# Patient Record
Sex: Male | Born: 1995 | Race: White | Hispanic: No | Marital: Single | Smoking: Never smoker
Health system: Southern US, Community
[De-identification: ages and names within clinical notes are randomized; demographics above are authoritative.]

## PROBLEM LIST (undated history)

## (undated) ENCOUNTER — Ambulatory Visit: Admission: EM | Payer: 59 | Source: Home / Self Care

## (undated) DIAGNOSIS — T7840XA Allergy, unspecified, initial encounter: Secondary | ICD-10-CM

## (undated) HISTORY — DX: Allergy, unspecified, initial encounter: T78.40XA

---
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
## 2014-04-24 ENCOUNTER — Encounter: Payer: Self-pay | Admitting: Family Medicine

## 2014-04-24 ENCOUNTER — Ambulatory Visit (INDEPENDENT_AMBULATORY_CARE_PROVIDER_SITE_OTHER): Payer: BC Managed Care – PPO | Admitting: Family Medicine

## 2014-04-24 VITALS — BP 98/60 | HR 76 | Temp 98.7°F | Resp 16 | Ht 70.5 in | Wt 163.2 lb

## 2014-04-24 DIAGNOSIS — G250 Essential tremor: Secondary | ICD-10-CM

## 2014-04-24 DIAGNOSIS — F329 Major depressive disorder, single episode, unspecified: Secondary | ICD-10-CM

## 2014-04-24 DIAGNOSIS — F32A Depression, unspecified: Secondary | ICD-10-CM

## 2014-04-24 DIAGNOSIS — R5383 Other fatigue: Secondary | ICD-10-CM

## 2014-04-24 MED ORDER — PROPRANOLOL HCL 10 MG PO TABS: 20.0000 mg | ORAL_TABLET | Freq: Two times a day (BID) | ORAL | Status: DC

## 2014-04-24 MED ORDER — FLUOXETINE HCL 20 MG PO TABS: 20.0000 mg | ORAL_TABLET | Freq: Every day | ORAL | Status: DC

## 2014-04-24 NOTE — Progress Notes (Addendum)
Subjective:  This chart was scribed for Jack Ray, MD by Erling Conte, Medical Scribe. This patient was seen in Room 22 and the patient's care was started at 4:53 PM.   Patient ID: Jack Hood, male    DOB: 02/17/1996, 18 y.o.   MRN: [id number]  Chief Complaint  Patient presents with  . Follow-up    MEDICATION - PROPRANOLOL  take 2 tablets 20 mg twice a day (71m), and STRESS x 3 months    HPI HPI Comments: LOSIE AMPAROis a 18y.o. male who presents to the [facility] for refill of Propanolol. Last seen by RChristell Faithin July 2014. He was diagnosed with benign tremor. Evaluated by neurology when he was a child. Multiple family members with tremor. Only affects his hands. Was treated with propanolol 3x per day by RThurmond Buttsfor essential tremor at the July office visit. Was eventually started at bedtime and titrated up as needed. Was doing well with 2 pills 2x per day as of phone notes in 2014.Per mother pt stopped taking it for a while and then the pt restarted it about 1 month ago. Pt has been taking it 2x per day and can see the benefits for his tremor. He does note some slight increased fatigue with the medication but states he was fatigued before that. Pt states he is probably sleeping around 10 hours a day. Pt states it is partly to do with being fatigued and also just not feeling motivated to get out of bed. No naps during the day. He admits to maybe 1 caffeine drink per day, usually tea with dinner.   He admits school is stressful. He is a sAdministrator, arts eEngineer, productionmajor at [employer] He notes some depression with school that began halfway through this semester. He has not met with any therapists about it. Pt notes feeling overwhelmed at times. He denied feeling this in high school but states that he did feel anxious and overwhelmed in middle school. Pt states that in middle school is was more social anxiety while now it is more academic pressure. Pt grades have dropped  from a 3.4 in his previous semesters to a 2.3 this past semester. He states that for fun he hangs out with his fraternity brothers. Pt [facility]ural [facility] [employer] Pt admits to some mild associated anhedonia.  Mother states he has been a lot more emotional and having some self worth issues. Pt just recently got out of a relationship. He has never taken medication for depression or anxiety in the past. He denies any other complaints at this time.   Interviewed without parent in room. Trouble with motivation as above, concentration.  Had breakup with relationship of 1 and 1/2 years few months ago that contributed some to depression symptoms.    There are no active problems to display for this patient.  Past Medical History  Diagnosis Date  . Allergy    No past surgical history on file. No Known Allergies Prior to Admission medications   Medication Sig Start Date End Date Taking? Authorizing Provider  propranolol (INDERAL) 10 MG tablet Take 2 tablets (20 mg total) by mouth 2 (two) times daily. 01/19/13  Yes RRise Mu PA-C   History   Social History  . Marital Status: Single    Spouse Name: N/A    Number of Children: N/A  . Years of Education: N/A   Occupational History  . Not on file.  Social History Main Topics  . Smoking status: Never Smoker   . Smokeless tobacco: Not on file  . Alcohol Use: No  . Drug Use: No  . Sexual Activity: No   Other Topics Concern  . Not on file   Social History Narrative   Single. Education: [employer]. Exercises everyday for 1-2 hours.           Review of Systems  Constitutional: Positive for fatigue.  Neurological: Positive for tremors (benign; chronic medical issue).  Psychiatric/Behavioral: Positive for dysphoric mood (due to academics). The patient is nervous/anxious (due to academics).        + anhedonia  All other systems reviewed and are negative.      Objective:   Physical Exam  Constitutional: He is oriented to  person, place, and time. He appears well-developed and well-nourished. No distress.  HENT:  Head: Normocephalic and atraumatic.  Eyes: Conjunctivae and EOM are normal.  Neck: Neck supple. No tracheal deviation present.  Cardiovascular: Normal rate.   Pulmonary/Chest: Effort normal. No respiratory distress.  Musculoskeletal: Normal range of motion.  Neurological: He is alert and oriented to person, place, and time.  Skin: Skin is warm and dry.  Psychiatric: He has a normal mood and affect. His behavior is normal.  Nursing note and vitals reviewed.    Filed Vitals:   04/24/14 1611  BP: 98/60  Pulse: 76  Temp: 98.7 F (37.1 C)  TempSrc: Oral  Resp: 16  Height: 5' 10.5" (1.791 m)  Weight: 163 lb 3.2 oz (74.027 kg)  SpO2: 97%       Assessment & Plan:   TARVARES LANT is a 18 y.o. male Fatigue due to depression - Plan: TSH, Depression - Plan: TSH, FLUoxetine (PROZAC) 20 MG tablet, DISCONTINUED: FLUoxetine (PROZAC) 20 MG tablet  -appears to be longstanding issue with recent worsening with school workload. Denies IDU/alcohol abuse or other high risk activities. Denies SI. Based on timing of symptoms - agreed on start of Prozac 37m QD, SED and discussed SI and what to do if this occurs. Phone number given for local counseling, but may be more beneficial to have this done at school. Follow up by phone or email in next 2 weeks, then OV in 6 weeks. RTC/ER precautions.   Essential tremor - Plan: TSH, propranolol (INDERAL) 10 MG tablet, DISCONTINUED: propranolol (INDERAL) 10 MG tablet  -refilled at prior dose - 285mBID. Stable on meds.   Meds ordered this encounter  Medications  . DISCONTD: FLUoxetine (PROZAC) 20 MG tablet    Sig: Take 1 tablet (20 mg total) by mouth daily.    Dispense:  30 tablet    Refill:  1  . DISCONTD: propranolol (INDERAL) 10 MG tablet    Sig: Take 2 tablets (20 mg total) by mouth 2 (two) times daily.    Dispense:  120 tablet    Refill:  3  . propranolol  (INDERAL) 10 MG tablet    Sig: Take 2 tablets (20 mg total) by mouth 2 (two) times daily.    Dispense:  120 tablet    Refill:  3  . FLUoxetine (PROZAC) 20 MG tablet    Sig: Take 1 tablet (20 mg total) by mouth daily.    Dispense:  30 tablet    Refill:  1   Patient Instructions  AaVivia [id number] Can also call your school's student health to arrange with counselor there.   recheck in 6 weeks to discuss prozac -  sooner if worse.      I personally performed the services described in this documentation, which was scribed in my presence. The recorded information has been reviewed and considered, and addended by me as needed.

## 2014-04-24 NOTE — Patient Instructions (Addendum)
Karmen Bongoaron Stewart: [phone]: [phone]   Can also call your school's student health to arrange with counselor there.   recheck in 6 weeks to discuss prozac - sooner if worse.

## 2014-04-25 LAB — TSH: TSH: 1.076 u[IU]/mL (ref 0.350–4.500)

## 2014-05-06 ENCOUNTER — Encounter: Payer: Self-pay | Admitting: *Deleted

## 2014-06-23 ENCOUNTER — Other Ambulatory Visit: Payer: Self-pay | Admitting: Family Medicine

## 2014-09-04 ENCOUNTER — Other Ambulatory Visit: Payer: Self-pay | Admitting: Family Medicine

## 2014-09-07 ENCOUNTER — Telehealth: Payer: Self-pay

## 2014-09-07 MED ORDER — FLUOXETINE HCL 20 MG PO TABS: ORAL_TABLET | ORAL | Status: DC

## 2014-09-07 NOTE — Telephone Encounter (Signed)
Meds ordered this encounter  Medications  . FLUoxetine (PROZAC) 20 MG tablet    Sig: TAKE 1 TABLET EVERY DAY.    Dispense:  30 tablet    Refill:  0    Order Specific Question:  Supervising Provider    Answer:  DOOLITTLE, ROBERT P [[id number]]

## 2014-09-07 NOTE — Telephone Encounter (Signed)
Pt's mother called regarding pt's Prozac. He is going to run out of his medication before he is able to see Dr. Neva SeatGreene. He comes back from college this weekend so he would be able to walk in to see Dr. Neva SeatGreene next week, but will be out before he is able to. He tried to get an appt, but Dr. Neva SeatGreene is booked out until July. He just needs enough until he is able to see Dr. Neva SeatGreene next week.   Pt's mother number: [phone] Richardson ChiquitoWendy Koral

## 2014-09-08 NOTE — Telephone Encounter (Signed)
Noted # 30 rx.  Thanks.

## 2014-09-14 ENCOUNTER — Ambulatory Visit (INDEPENDENT_AMBULATORY_CARE_PROVIDER_SITE_OTHER): Payer: BLUE CROSS/BLUE SHIELD | Admitting: Family Medicine

## 2014-09-14 VITALS — BP 108/58 | HR 57 | Temp 98.1°F | Resp 17 | Ht 70.0 in | Wt 174.6 lb

## 2014-09-14 DIAGNOSIS — F330 Major depressive disorder, recurrent, mild: Secondary | ICD-10-CM

## 2014-09-14 DIAGNOSIS — G250 Essential tremor: Secondary | ICD-10-CM

## 2014-09-14 MED ORDER — FLUOXETINE HCL 20 MG PO TABS: ORAL_TABLET | ORAL | Status: DC

## 2014-09-14 NOTE — Progress Notes (Signed)
Subjective:  This chart was scribed for Meredith StaggersJeffrey Lenetta Piche, MD by [facility] Hashem, medical scribe at [facility].The patient was seen in exam room 10 and the patient's care was started at 1:54 PM.   Patient ID: Jack Hood, male    DOB: 07/25/1995, 19 y.o.   MRN: [id number] Chief Complaint  Patient presents with  . Follow-up  . Medication Refill    Prozac   HPI HPI Comments: Jack Hood is a 19 y.o. male who presents to [facility] for a follow up for depression. Last seen in December of 2015. Noted some feeling of anxiety and overwhelmed. He stated he has anxiety since middle school. Grades dropped from 3.4 to a 2.3 that semester. Positive for anhedonia. He was started on Prozac 20 mg once a day and recommended counseling locally or back at school. Only side effect is increased hunger. Pt states that he is feeling much better, more motivated and happier all around. GPA was 2.8 this semester. Pt is exercising four or five hours a week. No new relationships. No alcohol, marijuana, no other drugs. He denies suicidal ideation. PT will be working at [employer] this summer.  Pt has benign essential resting tremors and he takes propranolol 10 mg twice daily.   There are no active problems to display for this patient.  Past Medical History  Diagnosis Date  . Allergy    History reviewed. No pertinent past surgical history. No Known Allergies Prior to Admission medications   Medication Sig Start Date End Date Taking? Authorizing Provider  FLUoxetine (PROZAC) 20 MG tablet TAKE 1 TABLET EVERY DAY. 09/07/14  Yes Chelle Tinnie GensJeffrey, PA-C  propranolol (INDERAL) 10 MG tablet Take 2 tablets (20 mg total) by mouth 2 (two) times daily. 04/24/14  Yes Shade FloodJeffrey R Suzana Sohail, MD   History   Social History  . Marital Status: Single    Spouse Name: N/A  . Number of Children: N/A  . Years of Education: N/A   Occupational History  . Not on file.   Social History  Main Topics  . Smoking status: Never Smoker   . Smokeless tobacco: Not on file  . Alcohol Use: No  . Drug Use: No  . Sexual Activity: No   Other Topics Concern  . Not on file   Social History Narrative   Single. Education: [employer]. Exercises everyday for 1-2 hours.         Review of Systems  Psychiatric/Behavioral: Positive for dysphoric mood.      Objective:  BP 108/58 mmHg  Pulse 57  Temp(Src) 98.1 F (36.7 C) (Oral)  Resp 17  Ht 5\' 10"  (1.778 m)  Wt 174 lb 9.6 oz (79.198 kg)  BMI 25.05 kg/m2  SpO2 98% Physical Exam  Constitutional: He is oriented to person, place, and time. He appears well-developed and well-nourished. No distress.  HENT:  Head: Normocephalic and atraumatic.  Eyes: Pupils are equal, round, and reactive to light.  Neck: Normal range of motion.  Cardiovascular: Normal rate and regular rhythm.   Pulmonary/Chest: Effort normal. No respiratory distress.  Musculoskeletal: Normal range of motion.  Neurological: He is alert and oriented to person, place, and time.  Skin: Skin is warm and dry.  Psychiatric: He has a normal mood and affect. His behavior is normal.  Nursing note and vitals reviewed.     Assessment & Plan:   Jack Hood is a 19 y.o. male Essential tremor  -  stable. continue propanolol at current dose.   Major depressive disorder, recurrent episode, mild - Plan: FLUoxetine (PROZAC) 20 MG tablet  -improved. Stable. Cont same dose Prozac, exercise for stress mgt, and recheck in 6 months. Sooner or email/call if change in sx's.    Meds ordered this encounter  Medications  . FLUoxetine (PROZAC) 20 MG tablet    Sig: TAKE 1 TABLET EVERY DAY.    Dispense:  90 tablet    Refill:  1   Patient Instructions  Glad to hear you are doing well. No change in meds for now. Follow up with me in 6 months.  Return to the clinic or go to the nearest emergency room if any of your symptoms worsen or new symptoms occur.   I personally performed  the services described in this documentation, which was scribed in my presence. The recorded information has been reviewed and considered, and addended by me as needed.

## 2014-09-14 NOTE — Patient Instructions (Signed)
Glad to hear you are doing well. No change in meds for now. Follow up with me in 6 months.  Return to the clinic or go to the nearest emergency room if any of your symptoms worsen or new symptoms occur.

## 2014-09-30 ENCOUNTER — Other Ambulatory Visit: Payer: Self-pay | Admitting: Family Medicine

## 2014-10-18 ENCOUNTER — Other Ambulatory Visit: Payer: Self-pay | Admitting: Physician Assistant

## 2014-11-09 ENCOUNTER — Other Ambulatory Visit: Payer: Self-pay | Admitting: Family Medicine

## 2014-11-13 ENCOUNTER — Emergency Department (HOSPITAL_COMMUNITY): Payer: No Typology Code available for payment source | Admitting: Certified Registered Nurse Anesthetist

## 2014-11-13 ENCOUNTER — Emergency Department (HOSPITAL_COMMUNITY): Payer: No Typology Code available for payment source

## 2014-11-13 ENCOUNTER — Ambulatory Visit (HOSPITAL_COMMUNITY)
Admission: EM | Admit: 2014-11-13 | Discharge: 2014-11-14 | Disposition: A | Discharge status: Home / Self Care | Payer: No Typology Code available for payment source | Attending: Emergency Medicine | Admitting: Emergency Medicine

## 2014-11-13 ENCOUNTER — Encounter (HOSPITAL_COMMUNITY)
Admission: EM | Disposition: A | Discharge status: Home / Self Care | Payer: Self-pay | Source: Home / Self Care | Attending: Emergency Medicine

## 2014-11-13 ENCOUNTER — Encounter (HOSPITAL_COMMUNITY): Payer: Self-pay

## 2014-11-13 DIAGNOSIS — F419 Anxiety disorder, unspecified: Secondary | ICD-10-CM | POA: Insufficient documentation

## 2014-11-13 DIAGNOSIS — S51822A Laceration with foreign body of left forearm, initial encounter: Secondary | ICD-10-CM | POA: Insufficient documentation

## 2014-11-13 DIAGNOSIS — S66327A Laceration of extensor muscle, fascia and tendon of left little finger at wrist and hand level, initial encounter: Secondary | ICD-10-CM | POA: Diagnosis not present

## 2014-11-13 DIAGNOSIS — T148 Other injury of unspecified body region: Secondary | ICD-10-CM | POA: Diagnosis present

## 2014-11-13 DIAGNOSIS — T148XXA Other injury of unspecified body region, initial encounter: Secondary | ICD-10-CM

## 2014-11-13 HISTORY — PX: APPLICATION OF WOUND VAC: SHX5189

## 2014-11-13 HISTORY — PX: I & D EXTREMITY: SHX5045

## 2014-11-13 LAB — CBC WITH DIFFERENTIAL/PLATELET
BASOS ABS: 0 10*3/uL (ref 0.0–0.1)
Basophils Relative: 0 % (ref 0–1)
EOS ABS: 0.2 10*3/uL (ref 0.0–0.7)
Eosinophils Relative: 2 % (ref 0–5)
HCT: 38.2 % — ABNORMAL LOW (ref 39.0–52.0)
Hemoglobin: 13.1 g/dL (ref 13.0–17.0)
LYMPHS ABS: 1.3 10*3/uL (ref 0.7–4.0)
LYMPHS PCT: 13 % (ref 12–46)
MCH: 31.1 pg (ref 26.0–34.0)
MCHC: 34.3 g/dL (ref 30.0–36.0)
MCV: 90.7 fL (ref 78.0–100.0)
MONO ABS: 0.6 10*3/uL (ref 0.1–1.0)
Monocytes Relative: 6 % (ref 3–12)
NEUTROS PCT: 79 % — AB (ref 43–77)
Neutro Abs: 7.6 10*3/uL (ref 1.7–7.7)
Platelets: 231 10*3/uL (ref 150–400)
RBC: 4.21 MIL/uL — AB (ref 4.22–5.81)
RDW: 12.7 % (ref 11.5–15.5)
WBC: 9.7 10*3/uL (ref 4.0–10.5)

## 2014-11-13 LAB — BASIC METABOLIC PANEL
Anion gap: 7 (ref 5–15)
BUN: 12 mg/dL (ref 6–20)
CO2: 24 mmol/L (ref 22–32)
CREATININE: 1.04 mg/dL (ref 0.61–1.24)
Calcium: 9.2 mg/dL (ref 8.9–10.3)
Chloride: 107 mmol/L (ref 101–111)
GFR calc non Af Amer: >60 mL/min (ref >60)
GLUCOSE: 118 mg/dL — AB (ref 65–99)
POTASSIUM: 4.6 mmol/L (ref 3.5–5.1)
SODIUM: 138 mmol/L (ref 135–145)

## 2014-11-13 SURGERY — IRRIGATION AND DEBRIDEMENT EXTREMITY
Anesthesia: General | Site: Elbow | Laterality: Left

## 2014-11-13 MED ORDER — SUCCINYLCHOLINE CHLORIDE 20 MG/ML IJ SOLN
INTRAMUSCULAR | Status: AC
  Filled 2014-11-13: qty 1

## 2014-11-13 MED ORDER — PROMETHAZINE HCL 25 MG/ML IJ SOLN: 6.2500 mg | INTRAMUSCULAR | Status: DC | PRN

## 2014-11-13 MED ORDER — PROPOFOL 10 MG/ML IV BOLUS
INTRAVENOUS | Status: AC
  Filled 2014-11-13: qty 20

## 2014-11-13 MED ORDER — CEFAZOLIN SODIUM 1-5 GM-% IV SOLN
1.0000 g | Freq: Once | INTRAVENOUS | Status: AC
  Administered 2014-11-13: 1 g via INTRAVENOUS
  Filled 2014-11-13: qty 50

## 2014-11-13 MED ORDER — LACTATED RINGERS IV SOLN
INTRAVENOUS | Status: DC | PRN
  Administered 2014-11-13 (×2): via INTRAVENOUS

## 2014-11-13 MED ORDER — ONDANSETRON HCL 4 MG/2ML IJ SOLN
INTRAMUSCULAR | Status: DC | PRN
  Administered 2014-11-13: 4 mg via INTRAVENOUS

## 2014-11-13 MED ORDER — MIDAZOLAM HCL 5 MG/5ML IJ SOLN
INTRAMUSCULAR | Status: DC | PRN
  Administered 2014-11-13: 2 mg via INTRAVENOUS

## 2014-11-13 MED ORDER — OXYCODONE-ACETAMINOPHEN 5-325 MG PO TABS: ORAL_TABLET | ORAL | Status: DC

## 2014-11-13 MED ORDER — FENTANYL CITRATE (PF) 100 MCG/2ML IJ SOLN
50.0000 ug | Freq: Once | INTRAMUSCULAR | Status: AC
  Administered 2014-11-13: 50 ug via INTRAVENOUS
  Filled 2014-11-13: qty 2

## 2014-11-13 MED ORDER — CEFAZOLIN SODIUM-DEXTROSE 2-3 GM-% IV SOLR
INTRAVENOUS | Status: AC
  Administered 2014-11-13: 2 g via INTRAVENOUS
  Filled 2014-11-13: qty 50

## 2014-11-13 MED ORDER — BUPIVACAINE HCL (PF) 0.25 % IJ SOLN
INTRAMUSCULAR | Status: DC | PRN
  Administered 2014-11-13: 30 mL

## 2014-11-13 MED ORDER — FENTANYL CITRATE (PF) 100 MCG/2ML IJ SOLN
INTRAMUSCULAR | Status: DC | PRN
  Administered 2014-11-13 (×5): 50 ug via INTRAVENOUS

## 2014-11-13 MED ORDER — HYDROMORPHONE HCL 1 MG/ML IJ SOLN
0.2500 mg | INTRAMUSCULAR | Status: DC | PRN
  Administered 2014-11-13 – 2014-11-14 (×2): 0.5 mg via INTRAVENOUS

## 2014-11-13 MED ORDER — LIDOCAINE HCL (CARDIAC) 20 MG/ML IV SOLN
INTRAVENOUS | Status: AC
  Filled 2014-11-13: qty 5

## 2014-11-13 MED ORDER — FENTANYL CITRATE (PF) 250 MCG/5ML IJ SOLN
INTRAMUSCULAR | Status: AC
  Filled 2014-11-13: qty 5

## 2014-11-13 MED ORDER — HYDROMORPHONE HCL 1 MG/ML IJ SOLN
INTRAMUSCULAR | Status: AC
  Filled 2014-11-13: qty 1

## 2014-11-13 MED ORDER — HYDROMORPHONE HCL 1 MG/ML IJ SOLN
1.0000 mg | Freq: Once | INTRAMUSCULAR | Status: AC
Start: 2014-11-13 — End: 2014-11-13
  Administered 2014-11-13: 1 mg via INTRAVENOUS
  Filled 2014-11-13: qty 1

## 2014-11-13 MED ORDER — DEXAMETHASONE SODIUM PHOSPHATE 4 MG/ML IJ SOLN
INTRAMUSCULAR | Status: AC
  Filled 2014-11-13: qty 1

## 2014-11-13 MED ORDER — LIDOCAINE HCL (CARDIAC) 20 MG/ML IV SOLN
INTRAVENOUS | Status: DC | PRN
  Administered 2014-11-13: 100 mg via INTRAVENOUS

## 2014-11-13 MED ORDER — BUPIVACAINE HCL (PF) 0.25 % IJ SOLN
INTRAMUSCULAR | Status: AC
  Filled 2014-11-13: qty 30

## 2014-11-13 MED ORDER — DEXAMETHASONE SODIUM PHOSPHATE 4 MG/ML IJ SOLN
INTRAMUSCULAR | Status: DC | PRN
  Administered 2014-11-13: 4 mg via INTRAVENOUS

## 2014-11-13 MED ORDER — MIDAZOLAM HCL 2 MG/2ML IJ SOLN
INTRAMUSCULAR | Status: AC
  Filled 2014-11-13: qty 2

## 2014-11-13 MED ORDER — ONDANSETRON HCL 4 MG/2ML IJ SOLN
INTRAMUSCULAR | Status: AC
  Filled 2014-11-13: qty 2

## 2014-11-13 MED ORDER — PROPOFOL 10 MG/ML IV BOLUS
INTRAVENOUS | Status: DC | PRN
  Administered 2014-11-13: 200 mg via INTRAVENOUS

## 2014-11-13 MED ORDER — SULFAMETHOXAZOLE-TRIMETHOPRIM 800-160 MG PO TABS: 1.0000 | ORAL_TABLET | Freq: Two times a day (BID) | ORAL | Status: DC

## 2014-11-13 MED ORDER — SODIUM CHLORIDE 0.9 % IR SOLN
Status: DC | PRN
  Administered 2014-11-13: 1000 mL

## 2014-11-13 SURGICAL SUPPLY — 54 items
BANDAGE COBAN STERILE 2 (GAUZE/BANDAGES/DRESSINGS) IMPLANT
BANDAGE ELASTIC 3 VELCRO ST LF (GAUZE/BANDAGES/DRESSINGS) ×4 IMPLANT
BANDAGE ELASTIC 4 VELCRO ST LF (GAUZE/BANDAGES/DRESSINGS) ×4 IMPLANT
BNDG CMPR 9X4 STRL LF SNTH (GAUZE/BANDAGES/DRESSINGS) ×2
BNDG COHESIVE 1X5 TAN STRL LF (GAUZE/BANDAGES/DRESSINGS) IMPLANT
BNDG CONFORM 2 STRL LF (GAUZE/BANDAGES/DRESSINGS) IMPLANT
BNDG ESMARK 4X9 LF (GAUZE/BANDAGES/DRESSINGS) ×2 IMPLANT
BNDG GAUZE ELAST 4 BULKY (GAUZE/BANDAGES/DRESSINGS) ×4 IMPLANT
CORDS BIPOLAR (ELECTRODE) ×4 IMPLANT
COVER SURGICAL LIGHT HANDLE (MISCELLANEOUS) ×4 IMPLANT
DECANTER SPIKE VIAL GLASS SM (MISCELLANEOUS) ×4 IMPLANT
DRAIN PENROSE 1/4X12 LTX STRL (WOUND CARE) IMPLANT
DRSG ADAPTIC 3X8 NADH LF (GAUZE/BANDAGES/DRESSINGS) IMPLANT
DRSG EMULSION OIL 3X3 NADH (GAUZE/BANDAGES/DRESSINGS) ×4 IMPLANT
DRSG PAD ABDOMINAL 8X10 ST (GAUZE/BANDAGES/DRESSINGS) ×8 IMPLANT
GAUZE SPONGE 4X4 12PLY STRL (GAUZE/BANDAGES/DRESSINGS) ×4 IMPLANT
GAUZE XEROFORM 1X8 LF (GAUZE/BANDAGES/DRESSINGS) ×4 IMPLANT
GAUZE XEROFORM 5X9 LF (GAUZE/BANDAGES/DRESSINGS) ×2 IMPLANT
GLOVE BIO SURGEON STRL SZ7.5 (GLOVE) ×4 IMPLANT
GLOVE BIOGEL PI IND STRL 8 (GLOVE) ×2 IMPLANT
GLOVE BIOGEL PI INDICATOR 8 (GLOVE) ×2
GOWN STRL REUS W/ TWL LRG LVL3 (GOWN DISPOSABLE) ×2 IMPLANT
GOWN STRL REUS W/TWL LRG LVL3 (GOWN DISPOSABLE) ×4
KIT BASIN OR (CUSTOM PROCEDURE TRAY) ×4 IMPLANT
KIT ROOM TURNOVER OR (KITS) ×4 IMPLANT
LOOP VESSEL MAXI BLUE (MISCELLANEOUS) ×3 IMPLANT
LOOP VESSEL MINI RED (MISCELLANEOUS) IMPLANT
MANIFOLD NEPTUNE II (INSTRUMENTS) ×4 IMPLANT
NDL HYPO 25X1 1.5 SAFETY (NEEDLE) IMPLANT
NEEDLE HYPO 25X1 1.5 SAFETY (NEEDLE) ×4 IMPLANT
NS IRRIG 1000ML POUR BTL (IV SOLUTION) ×4 IMPLANT
PACK ORTHO EXTREMITY (CUSTOM PROCEDURE TRAY) ×4 IMPLANT
PAD ARMBOARD 7.5X6 YLW CONV (MISCELLANEOUS) ×8 IMPLANT
PAD CAST 4YDX4 CTTN HI CHSV (CAST SUPPLIES) ×1 IMPLANT
PADDING CAST COTTON 4X4 STRL (CAST SUPPLIES) ×4
SCRUB BETADINE 4OZ XXX (MISCELLANEOUS) ×4 IMPLANT
SET CYSTO W/LG BORE CLAMP LF (SET/KITS/TRAYS/PACK) ×4 IMPLANT
SOLUTION BETADINE 4OZ (MISCELLANEOUS) ×4 IMPLANT
SPLINT FIBERGLASS 3X12 (CAST SUPPLIES) ×2 IMPLANT
SPONGE LAP 18X18 X RAY DECT (DISPOSABLE) ×4 IMPLANT
SPONGE LAP 4X18 X RAY DECT (DISPOSABLE) ×4 IMPLANT
SUCTION FRAZIER TIP 10 FR DISP (SUCTIONS) ×4 IMPLANT
SUT ETHILON 4 0 PS 2 18 (SUTURE) ×4 IMPLANT
SUT MON AB 5-0 P3 18 (SUTURE) IMPLANT
SYR CONTROL 10ML LL (SYRINGE) IMPLANT
TOWEL OR 17X24 6PK STRL BLUE (TOWEL DISPOSABLE) ×4 IMPLANT
TOWEL OR 17X26 10 PK STRL BLUE (TOWEL DISPOSABLE) ×4 IMPLANT
TUBE ANAEROBIC SPECIMEN COL (MISCELLANEOUS) IMPLANT
TUBE CONNECTING 12'X1/4 (SUCTIONS) ×1
TUBE CONNECTING 12X1/4 (SUCTIONS) ×3 IMPLANT
TUBE FEEDING 5FR 15 INCH (TUBING) IMPLANT
UNDERPAD 30X30 INCONTINENT (UNDERPADS AND DIAPERS) ×4 IMPLANT
WATER STERILE IRR 1000ML POUR (IV SOLUTION) ×4 IMPLANT
YANKAUER SUCT BULB TIP NO VENT (SUCTIONS) ×4 IMPLANT

## 2014-11-13 NOTE — Discharge Instructions (Signed)

## 2014-11-13 NOTE — ED Notes (Signed)
Patient transported to x-ray. ?

## 2014-11-13 NOTE — Addendum Note (Signed)
Addendum  created 11/13/14 2343 by Heather RobertsJames Sakira Dahmer, MD   Modules edited: Clinical Notes   Clinical Notes:  File: [id number]

## 2014-11-13 NOTE — Anesthesia Procedure Notes (Signed)
Procedure Name: LMA Insertion Date/Time: 11/13/2014 9:33 PM Performed by: Yuri Flener S Pre-anesthesia Checklist: Patient identified, Timeout performed, Emergency Drugs available, Suction available and Patient being monitored Patient Re-evaluated:Patient Re-evaluated prior to inductionOxygen Delivery Method: Circle system utilized Preoxygenation: Pre-oxygenation with 100% oxygen Intubation Type: IV induction Ventilation: Mask ventilation without difficulty LMA: LMA inserted LMA Size: 4.0 Tube type: Oral Number of attempts: 1 Placement Confirmation: positive ETCO2 and breath sounds checked- equal and bilateral Tube secured with: Tape Dental Injury: Teeth and Oropharynx as per pre-operative assessment

## 2014-11-13 NOTE — Transfer of Care (Signed)
Immediate Anesthesia Transfer of Care Note  Patient: Jack Hood  Procedure(s) Performed: Procedure(s): IRRIGATION AND DEBRIDEMENT LEFT ELBOW AND HAND (Left) APPLICATION OF WOUND VAC (Left)  Patient Location: PACU  Anesthesia Type:General  Level of Consciousness: awake  Airway & Oxygen Therapy: Patient Spontanous Breathing and Patient connected to nasal cannula oxygen  Post-op Assessment: Report given to RN and Post -op Vital signs reviewed and stable  Post vital signs: Reviewed and stable  Last Vitals:  Filed Vitals:   11/13/14 1811  BP: 113/45  Pulse: 70  Temp:   Resp: 18    Complications: No apparent anesthesia complications

## 2014-11-13 NOTE — Brief Op Note (Signed)
11/13/2014  10:40 PM  PATIENT:  Jack Hood  19 y.o. male  PRE-OPERATIVE DIAGNOSIS:  LEFT ELBOW AND HAND WOUND, FOREIGN BODY  POST-OPERATIVE DIAGNOSIS:  LEFT ELBOW AND HAND WOUND, FOREIGN BODY, small finger extensor tendon laceration  PROCEDURE:  Irrigation and debridement left hand wound and left proximal forearm wound (~8 x 4 cm) with removal foreign matter  SURGEON:  Surgeon(s) and Role:    * Betha LoaKevin Kaushik Maul, MD - Primary  PHYSICIAN ASSISTANT:   ASSISTANTS: none   ANESTHESIA:   general  EBL:  Total I/O In: 1000 [I.V.:1000] Out: -   BLOOD ADMINISTERED:none  DRAINS: iodoform packing at elbow, vessel loop drain left hand  LOCAL MEDICATIONS USED:  MARCAINE     SPECIMEN:  No Specimen  DISPOSITION OF SPECIMEN:  N/A  COUNTS:  YES  TOURNIQUET:   Total Tourniquet Time Documented: Upper Arm (Left) - 42 minutes Total: Upper Arm (Left) - 42 minutes   DICTATION: .Other Dictation: Dictation Number [id number]  PLAN OF CARE: Discharge to home after PACU  PATIENT DISPOSITION:  PACU - hemodynamically stable.

## 2014-11-13 NOTE — ED Notes (Signed)
Wound to elbow irrigated and wet to dry dressing applied.  All other injuries cleaned at this time.

## 2014-11-13 NOTE — Op Note (Signed)
[id number] 

## 2014-11-13 NOTE — Op Note (Signed)
Intra-operative fluoroscopic images in the AP, lateral, and oblique views were taken and evaluated by myself.  Reduction and hardware placement were confirmed.  There was no intraarticular penetration of permanent hardware.  
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
## 2014-11-13 NOTE — Anesthesia Preprocedure Evaluation (Addendum)
Anesthesia Evaluation  Patient identified by MRN, date of birth, ID band Patient awake    Reviewed: Allergy & Precautions, NPO status , Patient's Chart, lab work & pertinent test results  History of Anesthesia Complications Negative for: history of anesthetic complications  Airway Mallampati: II  TM Distance: >3 FB Neck ROM: Full    Dental  (+) Teeth Intact, Dental Advisory Given   Pulmonary neg pulmonary ROS,    Pulmonary exam normal       Cardiovascular negative cardio ROS Normal cardiovascular exam    Neuro/Psych Anxiety tremors    GI/Hepatic negative GI ROS, Neg liver ROS,   Endo/Other  negative endocrine ROS  Renal/GU negative Renal ROS  negative genitourinary   Musculoskeletal   Abdominal   Peds negative pediatric ROS (+)  Hematology negative hematology ROS (+)   Anesthesia Other Findings   Reproductive/Obstetrics negative OB ROS                           Anesthesia Physical Anesthesia Plan  ASA: II and emergent  Anesthesia Plan: General   Post-op Pain Management:    Induction: Intravenous  Airway Management Planned: LMA  Additional Equipment:   Intra-op Plan:   Post-operative Plan: Extubation in OR  Informed Consent: I have reviewed the patients History and Physical, chart, labs and discussed the procedure including the risks, benefits and alternatives for the proposed anesthesia with the patient or authorized representative who has indicated his/her understanding and acceptance.   Dental advisory given  Plan Discussed with: CRNA, Anesthesiologist and Surgeon  Anesthesia Plan Comments:     Anesthesia Quick Evaluation
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
## 2014-11-13 NOTE — Anesthesia Postprocedure Evaluation (Signed)
Anesthesia Post Note  Patient: Jack Hood  Procedure(s) Performed: Procedure(s) (LRB): IRRIGATION AND DEBRIDEMENT LEFT ELBOW AND HAND (Left) APPLICATION OF WOUND VAC (Left)  Anesthesia type: general  Patient location: PACU  Post pain: Pain level controlled  Post assessment: Patient's Cardiovascular Status Stable  Last Vitals:  Filed Vitals:   11/13/14 2246  BP: 96/36  Pulse: 68  Temp: 37 C  Resp: 10    Post vital signs: Reviewed and stable  Level of consciousness: sedated  Complications: No apparent anesthesia complications

## 2014-11-14 ENCOUNTER — Encounter (HOSPITAL_COMMUNITY): Payer: Self-pay | Admitting: Orthopedic Surgery

## 2014-11-14 DIAGNOSIS — S66327A Laceration of extensor muscle, fascia and tendon of left little finger at wrist and hand level, initial encounter: Secondary | ICD-10-CM | POA: Diagnosis not present
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
## 2014-12-23 ENCOUNTER — Other Ambulatory Visit: Payer: Self-pay | Admitting: Physician Assistant

## 2015-02-24 ENCOUNTER — Other Ambulatory Visit: Payer: Self-pay | Admitting: Physician Assistant

## 2015-03-29 ENCOUNTER — Other Ambulatory Visit: Payer: Self-pay | Admitting: Physician Assistant

## 2015-04-25 ENCOUNTER — Other Ambulatory Visit: Payer: Self-pay | Admitting: Family Medicine

## 2015-04-28 ENCOUNTER — Other Ambulatory Visit: Payer: Self-pay | Admitting: Physician Assistant

## 2015-05-28 ENCOUNTER — Other Ambulatory Visit: Payer: Self-pay | Admitting: Family Medicine

## 2015-06-16 ENCOUNTER — Other Ambulatory Visit: Payer: Self-pay | Admitting: Physician Assistant

## 2015-06-30 ENCOUNTER — Other Ambulatory Visit: Payer: Self-pay | Admitting: Family Medicine

## 2015-09-25 ENCOUNTER — Ambulatory Visit (INDEPENDENT_AMBULATORY_CARE_PROVIDER_SITE_OTHER): Payer: BLUE CROSS/BLUE SHIELD | Admitting: Physician Assistant

## 2015-09-25 VITALS — BP 118/76 | HR 52 | Temp 98.4°F | Resp 16 | Ht 71.0 in | Wt 181.0 lb

## 2015-09-25 DIAGNOSIS — J019 Acute sinusitis, unspecified: Secondary | ICD-10-CM | POA: Diagnosis not present

## 2015-09-25 MED ORDER — AMOXICILLIN-POT CLAVULANATE 875-125 MG PO TABS: 1.0000 | ORAL_TABLET | Freq: Two times a day (BID) | ORAL | Status: AC

## 2015-09-25 MED ORDER — FLUTICASONE PROPIONATE 50 MCG/ACT NA SUSP: 2.0000 | Freq: Every day | NASAL | Status: DC

## 2015-09-25 MED ORDER — CETIRIZINE HCL 10 MG PO TABS: 10.0000 mg | ORAL_TABLET | Freq: Every day | ORAL | Status: DC

## 2015-09-25 NOTE — Patient Instructions (Addendum)
     IF you received an x-ray today, you will receive an invoice from [facility]. Please contact [facility] at [phone] with questions or concerns regarding your invoice.   IF you received labwork today, you will receive an invoice from [facility]. Please contact [facility] at [phone] with questions or concerns regarding your invoice.   Our billing staff will not be able to assist you with questions regarding bills from these companies.  You will be contacted with the lab results as soon as they are available. The fastest way to get your results is to activate your My Chart account. Instructions are located on the last page of this paperwork. If you have not heard from us regarding the results in 2 weeks, please contact this office.    Please do the flonase and the augmentin.   I would also like you to try a Nedi-pot.  You can get this over-the-counter.   We are also going to do zyrtec to make sure this is not an allergic reaction.   If you develop eye pain, redness, swelling, discharge, fever--you need to return or go to a local ED.

## 2015-09-25 NOTE — Progress Notes (Signed)
[facility] [address] [place] [address] [phone]  Date:  09/25/2015   Name:  Jack Hood   DOB:  August 18, 1995   MRN:  [id number]  PCP:  No primary care provider on file.   Chief Complaint  Patient presents with  . Nasal Congestion    x 1 week   . Sore Throat    History of Present Illness:  Jack Hood is a 20 y.o. male patient who presents to [facility] for cc of nasal congestion and burning and scratching sensation of nostrils.  Nasal color is thick but clear.  He has not had any fever.  There is no actual sinus tenderness.  This has lasted for about 10 days.  He has some sore throat.  There is some coughing that is non-productive.  No sob or dyspnea.     There are no active problems to display for this patient.   Past Medical History  Diagnosis Date  . Allergy     Past Surgical History  Procedure Laterality Date  . I&d extremity Left 11/13/2014    Procedure: IRRIGATION AND DEBRIDEMENT LEFT ELBOW AND HAND;  Surgeon: Betha Loa, MD;  Location: [facility] OR;  Service: Orthopedics;  Laterality: Left;  . Application of wound vac Left 11/13/2014    Procedure: APPLICATION OF WOUND VAC;  Surgeon: Betha Loa, MD;  Location: [facility] OR;  Service: Orthopedics;  Laterality: Left;    Social History  Substance Use Topics  . Smoking status: Never Smoker   . Smokeless tobacco: Never Used  . Alcohol Use: No    Family History  Problem Relation Age of Onset  . Hyperlipidemia Father   . COPD Maternal Grandfather     No Known Allergies  Medication list has been reviewed and updated.  Current Outpatient Prescriptions on File Prior to Visit  Medication Sig Dispense Refill  . FLUoxetine (PROZAC) 20 MG tablet TAKE 1 TABLET EVERY DAY. 15 tablet 0  . propranolol (INDERAL) 10 MG tablet TAKE 2 TABLETS TWICE A DAY. "NO MORE REFILLS WITHOUT OFFICE VISIT 30 tablet 0   No current facility-administered medications on file prior to visit.    ROS ROS otherwise  unremarkable unless listed above.    Physical Examination: BP 118/76 mmHg  Pulse 52  Temp(Src) 98.4 F (36.9 C) (Oral)  Resp 16  Ht  (1.803 m)  Wt 181 lb (82.101 kg)  BMI 25.26 kg/m2  SpO2 98% Ideal Body Weight: Weight in (lb) to have BMI = 25: 178.9  Physical Exam  Constitutional: He is oriented to person, place, and time. He appears well-developed and well-nourished. No distress.  HENT:  Head: Atraumatic.  Right Ear: Tympanic membrane, external ear and ear canal normal.  Left Ear: Tympanic membrane, external ear and ear canal normal.  Nose: Mucosal edema and rhinorrhea present. Right sinus exhibits no maxillary sinus tenderness and no frontal sinus tenderness. Left sinus exhibits no maxillary sinus tenderness and no frontal sinus tenderness.  Mouth/Throat: No uvula swelling. Posterior oropharyngeal erythema (shiny with mucus in the back of oropharynx) present. No oropharyngeal exudate or posterior oropharyngeal edema.  Eyes: Conjunctivae, EOM and lids are normal. Pupils are equal, round, and reactive to light. Right eye exhibits normal extraocular motion. Left eye exhibits normal extraocular motion.  Neck: Trachea normal and full passive range of motion without pain. No edema and no erythema present.  Cardiovascular: Normal rate.   Pulmonary/Chest: Effort normal. No respiratory distress. He has no decreased breath  sounds. He has no wheezes. He has no rhonchi.  Neurological: He is alert and oriented to person, place, and time.  Skin: Skin is warm and dry. He is not diaphoretic.  Psychiatric: He has a normal mood and affect. His behavior is normal.     Assessment and Plan: Jack Hood is a 20 y.o. male who is here today for cc of sinus pains Given the longevity of illness, we will go ahead and treat with an antibiotic today. I have also advised him to start a second generation antihistamine and nasal spray at this time. He was also instructed to do heavy hydration.    Subacute sinusitis, unspecified location - Plan: amoxicillin-clavulanate (AUGMENTIN) 875-125 MG tablet, fluticasone (FLONASE) 50 MCG/ACT nasal spray, cetirizine (ZYRTEC) 10 MG tablet  Jack PlattStephanie Montrell Cessna, PA-C [facility] 09/25/2015 8:36 AM

## 2015-10-30 ENCOUNTER — Other Ambulatory Visit: Payer: Self-pay | Admitting: Family Medicine

## 2015-11-15 ENCOUNTER — Ambulatory Visit (INDEPENDENT_AMBULATORY_CARE_PROVIDER_SITE_OTHER): Payer: BLUE CROSS/BLUE SHIELD | Admitting: Family Medicine

## 2015-11-15 ENCOUNTER — Encounter: Payer: Self-pay | Admitting: Family Medicine

## 2015-11-15 VITALS — BP 110/70 | HR 81 | Temp 97.9°F | Resp 16 | Ht 70.5 in | Wt 180.2 lb

## 2015-11-15 DIAGNOSIS — F329 Major depressive disorder, single episode, unspecified: Secondary | ICD-10-CM | POA: Diagnosis not present

## 2015-11-15 DIAGNOSIS — G250 Essential tremor: Secondary | ICD-10-CM | POA: Diagnosis not present

## 2015-11-15 DIAGNOSIS — F32A Depression, unspecified: Secondary | ICD-10-CM

## 2015-11-15 MED ORDER — PROPRANOLOL HCL 10 MG PO TABS: ORAL_TABLET | ORAL | Status: DC

## 2015-11-15 MED ORDER — FLUOXETINE HCL 20 MG PO TABS: ORAL_TABLET | ORAL | Status: DC

## 2015-11-15 NOTE — Patient Instructions (Signed)
     IF you received an x-ray today, you will receive an invoice from [facility]. Please contact [facility] at [phone] with questions or concerns regarding your invoice.   IF you received labwork today, you will receive an invoice from [facility]. Please contact [facility] at [phone] with questions or concerns regarding your invoice.   Our billing staff will not be able to assist you with questions regarding bills from these companies.  You will be contacted with the lab results as soon as they are available. The fastest way to get your results is to activate your My Chart account. Instructions are located on the last page of this paperwork. If you have not heard from us regarding the results in 2 weeks, please contact this office.      

## 2015-11-15 NOTE — Progress Notes (Signed)
By signing my name below, I, Mesha Guinyard, attest that this documentation has been prepared under the direction and in the presence of Meredith StaggersJeffrey Adaline Trejos, MD.  Electronically Signed: Arvilla MarketMesha Guinyard, Medical Scribe. 11/15/2015. 3:53 PM.  Subjective:    Patient ID: Jack Hood, male    DOB: 1995-08-05, 20 y.o.   MRN: [id number]  HPI Chief Complaint  Patient presents with  . Medication Refill    Fluoxetine HCI 20 mg    HPI Comments: Jack SjogrenLane T Scarpati is a 20 y.o. male who [facility] follow-up of depression. Last visit with me was May 16th; had some decline at school and some depressive symptoms in December 2015. He was started on Prozac 20 mg. Improving at last visit. Continued same dose and recommended continuing exercise for stress management with plan for follow-up in 6 months. Pt states he's been doing good. Pt states his grades last year was good and he's continuing the same major. Pt will occasionally miss a dose of Prozac, but otherwise he takes it regularly. Pt exercises almost everyday. Pt will go out for a couple of drinks once a week. Pt denies experiencing manic episodes, suicidal ideation, thoughts of self injury, and marijuana abuse. Pt denies experiencing any side affects of Prozac.  Essential Tremor: Pt takes Inderal. Pt denies light-headedness, dizziness, and HAs.  Depression screen [facility] 2/9 11/15/2015 09/25/2015 09/14/2014 04/24/2014  Decreased Interest 0 0 0 2  Down, Depressed, Hopeless 0 0 0 1  PHQ - 2 Score 0 0 0 3  Altered sleeping - - - 3  Tired, decreased energy - - - 2  Change in appetite - - - 2  Feeling bad or failure about yourself  - - - 1  Trouble concentrating - - - 3  Moving slowly or fidgety/restless - - - 1  Suicidal thoughts - - - 0  PHQ-9 Score - - - 15   There are no active problems to display for this patient.  Past Medical History  Diagnosis Date  . Allergy    Past Surgical History  Procedure Laterality Date    . I&d extremity Left 11/13/2014    Procedure: IRRIGATION AND DEBRIDEMENT LEFT ELBOW AND HAND;  Surgeon: Betha LoaKevin Kuzma, MD;  Location: [facility] OR;  Service: Orthopedics;  Laterality: Left;  . Application of wound vac Left 11/13/2014    Procedure: APPLICATION OF WOUND VAC;  Surgeon: Betha LoaKevin Kuzma, MD;  Location: [facility] OR;  Service: Orthopedics;  Laterality: Left;   No Known Allergies Prior to Admission medications   Medication Sig Start Date End Date Taking? Authorizing Provider  FLUoxetine (PROZAC) 20 MG tablet TAKE 1 TABLET EVERY DAY. 06/30/15  Yes Shade FloodJeffrey R Ali Mohl, MD  propranolol (INDERAL) 10 MG tablet TAKE 2 TABLETS TWICE A DAY. "NO MORE REFILLS WITHOUT OFFICE VISIT 06/18/15  Yes Chelle Jeffery, PA-C  cetirizine (ZYRTEC) 10 MG tablet Take 1 tablet (10 mg total) by mouth daily. Patient not taking: Reported on 11/15/2015 09/25/15   Collie SiadStephanie D English, PA  fluticasone [facility](FLONASE) 50 MCG/ACT nasal spray Place 2 sprays into both nostrils daily. Patient not taking: Reported on 11/15/2015 09/25/15   Garnetta BuddyStephanie D English, PA   Social History   Social History  . Marital Status: Single    Spouse Name: N/A  . Number of Children: N/A  . Years of Education: N/A   Occupational History  . Not on file.   Social History Main Topics  . Smoking status: Never Smoker   .  Smokeless tobacco: Never Used  . Alcohol Use: No  . Drug Use: No  . Sexual Activity: No   Other Topics Concern  . Not on file   Social History Narrative   Single. Education: [employer]. Exercises everyday for 1-2 hours.         Review of Systems  Neurological: Negative for dizziness, light-headedness and headaches.  Psychiatric/Behavioral: Negative for suicidal ideas, self-injury and dysphoric mood.   Objective:  BP 110/70 mmHg  Pulse 81  Temp(Src) 97.9 F (36.6 C) (Oral)  Resp 16  Ht 5' 10.5" (1.791 m)  Wt 180 lb 3.2 oz (81.738 kg)  BMI 25.48 kg/m2  SpO2 97%  Physical Exam  Constitutional: He appears well-developed and  well-nourished. No distress.  HENT:  Head: Normocephalic and atraumatic.  Eyes: Conjunctivae are normal.  Neck: Neck supple.  Cardiovascular: Normal rate.   Pulmonary/Chest: Effort normal.  Neurological: He is alert.  Skin: Skin is warm and dry.  Psychiatric: He has a normal mood and affect. His behavior is normal.  Nursing note and vitals reviewed.  Assessment & Plan:   Jack Hood is a 20 y.o. male Essential tremor - Plan: propranolol (INDERAL) 10 MG tablet  -Stable. Continue propanolol at 20 mg twice a day dosing. Tolerating this dose without difficulty.  Depression - Plan: FLUoxetine (PROZAC) 20 MG tablet  -Stable. No new side effects of SSRI. Not ready for trial off medication yet. Continue Prozac 20 mg daily.  Meds ordered this encounter  Medications  . FLUoxetine (PROZAC) 20 MG tablet    Sig: TAKE 1 TABLET EVERY DAY.    Dispense:  90 tablet    Refill:  2  . propranolol (INDERAL) 10 MG tablet    Sig: TAKE 2 TABLETS TWICE A DAY.    Dispense:  360 tablet    Refill:  2   Patient Instructions       IF you received an x-ray today, you will receive an invoice from [facility]. Please contact [facility] at [phone] with questions or concerns regarding your invoice.   IF you received labwork today, you will receive an invoice from [facility]. Please contact [facility] at [phone] with questions or concerns regarding your invoice.   Our billing staff will not be able to assist you with questions regarding bills from these companies.  You will be contacted with the lab results as soon as they are available. The fastest way to get your results is to activate your My Chart account. Instructions are located on the last page of this paperwork. If you have not heard from [place] regarding the results in 2 weeks, please contact this office.        I personally performed the services described in this documentation, which was  scribed in my presence. The recorded information has been reviewed and considered, and addended by me as needed.   Signed,   Meredith Staggers, MD [facility].  11/17/2015 10:08 PM
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
## 2016-04-22 ENCOUNTER — Ambulatory Visit (INDEPENDENT_AMBULATORY_CARE_PROVIDER_SITE_OTHER): Payer: BLUE CROSS/BLUE SHIELD | Admitting: Family Medicine

## 2016-04-22 VITALS — BP 124/80 | HR 72 | Temp 98.8°F | Resp 17 | Ht 70.5 in | Wt 175.0 lb

## 2016-04-22 DIAGNOSIS — F329 Major depressive disorder, single episode, unspecified: Secondary | ICD-10-CM

## 2016-04-22 DIAGNOSIS — Z558 Other problems related to education and literacy: Secondary | ICD-10-CM | POA: Diagnosis not present

## 2016-04-22 DIAGNOSIS — R5383 Other fatigue: Secondary | ICD-10-CM

## 2016-04-22 DIAGNOSIS — F32A Depression, unspecified: Secondary | ICD-10-CM

## 2016-04-22 MED ORDER — FLUOXETINE HCL 20 MG PO TABS: ORAL_TABLET | ORAL | 1 refills | Status: DC

## 2016-04-22 NOTE — Progress Notes (Addendum)
Subjective:  By signing my name below, I, Stann Ore, attest that this documentation has been prepared under the direction and in the presence of Jack Staggers, MD. Electronically Signed: Stann Ore, Scribe. 04/22/2016 , 11:27 AM .  Patient was seen in Room 8 .   Patient ID: Jack Hood, male    DOB: [birth year], 20 y.o.   MRN: [id number] Chief Complaint  Patient presents with  . Follow-up    Talk about prozac medicine    HPI Jack Hood is a 20 y.o. male  Patient was last seen in July, initially seen in Dec 2015 with decline in school. He was prescribed prozac 20mg  qd and was doing well initially. His grades last year were doing well. He also takes inderal for essential tremors. His last TSH was normal in Dec 2015.   Wt Readings from Last 3 Encounters:  04/22/16 175 lb (79.4 kg)  11/15/15 180 lb 3.2 oz (81.7 kg)  09/25/15 181 lb (82.1 kg)   Patient has been having trouble in school, and his parents want him to be seen for options. Last year, his grades were B's and C's, but notes "they were easier classes". He's back struggling in school (this semester has a few D's and an F) with concentration issues. He describes feeling anxious and overwhelmed. For exams, he can't stay on one question long enough and would jump around questions trying to get as much done. He has not been eating well due to appetite loss and fatigue. He denies history of learning disability. He was close to a [employer] in high school. He denies SI, or self injury. He has some alcohol use. He denies marijuana use or other illicit drug use.   He has a psycho-educational eval coming up soon. He denies seeing a counselor yet.   There are no active problems to display for this patient.  Past Medical History:  Diagnosis Date  . Allergy    Past Surgical History:  Procedure Laterality Date  . APPLICATION OF WOUND VAC Left 11/13/2014   Procedure: APPLICATION OF WOUND VAC;  Surgeon: Betha Loa, MD;   [facility];  Service: Orthopedics;  Laterality: Left;  . I&D EXTREMITY Left 11/13/2014   Procedure: IRRIGATION AND DEBRIDEMENT LEFT ELBOW AND HAND;  Surgeon: Betha Loa, MD;  Location[facility];  Service: Orthopedics;  Laterality: Left;   No Known Allergies Prior to Admission medications   Medication Sig Start Date End Date Taking? Authorizing Provider  FLUoxetine (PROZAC) 20 MG tablet TAKE 1 TABLET EVERY DAY. 11/15/15  Yes Shade Flood, MD  propranolol (INDERAL) 10 MG tablet TAKE 2 TABLETS TWICE A DAY. 11/15/15  Yes Shade Flood, MD   Social History   Social History  . Marital status: Single    Spouse name: N/A  . Number of children: N/A  . Years of education: N/A   Occupational History  . Not on file.   Social History Main Topics  . Smoking status: Never Smoker  . Smokeless tobacco: Never Used  . Alcohol use No  . Drug use: No  . Sexual activity: No   Other Topics Concern  . Not on file   Social History Narrative   Single. Education: [employer]. Exercises everyday for 1-2 hours.         Review of Systems  Constitutional: Positive for activity change, appetite change and unexpected weight change. Negative for fatigue.  Eyes: Negative for visual disturbance.  Respiratory: Negative for cough, chest  tightness and shortness of breath.   Cardiovascular: Negative for chest pain, palpitations and leg swelling.  Gastrointestinal: Negative for abdominal pain and blood in stool.  Neurological: Negative for dizziness, light-headedness and headaches.  Psychiatric/Behavioral: Positive for dysphoric mood. The patient is nervous/anxious.        Objective:   Physical Exam  Constitutional: He is oriented to person, place, and time. He appears well-developed and well-nourished.  HENT:  Head: Normocephalic and atraumatic.  Eyes: EOM are normal. Pupils are equal, round, and reactive to light.  Neck: No JVD present. Carotid bruit is not present. No thyromegaly present.    Cardiovascular: Normal rate, regular rhythm and normal heart sounds.   No murmur heard. Pulmonary/Chest: Effort normal and breath sounds normal. He has no rales.  Musculoskeletal: He exhibits no edema.  Lymphadenopathy:    He has no cervical adenopathy.  Neurological: He is alert and oriented to person, place, and time.  Skin: Skin is warm and dry.  Psychiatric: He has a normal mood and affect.  Vitals reviewed.   Vitals:   04/22/16 1014  BP: 124/80  Pulse: 72  Resp: 17  Temp: 98.8 F (37.1 C)  TempSrc: Oral  SpO2: 96%  Weight: 175 lb (79.4 kg)  Height: 5' 10.5" (1.791 m)      Assessment & Plan:   Jack Hood is a 20 y.o. male Other fatigue  Depression, unspecified depression type - Plan: TSH, FLUoxetine (PROZAC) 20 MG tablet  Deterioration in school performance  Previous depression/anxiety symptoms improved with Prozac 20 mg. Has now been on that same dose for some time, and decrease in school performance this past semester. He does admit to some difficulty with school performance previously, but no previous diagnosis of attention deficit disorder or other learning disability. Appears to have some test taking anxiety and decreased focus during testing, but also some difficulty with setting by his report  - Psychoeducational eval apparently is being pursued already, but name provided. Would hold on Adderall or other stimulant for ADD at this time until formally diagnosed, especially with his history of tremor that is well treated currently.  - check tsh   - Provided note for increased time with test taking in school.  -Meet with psychologist to discuss anxiety/depression and focus symptoms in addition to evaluation as above.  -Trial of increase Prozac to 40 mg daily, follow up by Mychart or office visit in the next 4-6 weeks. Sooner if worsening symptoms or new side effects.  Meds ordered this encounter  Medications  . FLUoxetine (PROZAC) 20 MG tablet    Sig: TAKE 1  TABLET EVERY DAY.    Dispense:  180 tablet    Refill:  1   Patient Instructions    We can try increased dose of Prozac for now, but I would like you to meet with a psychologist for psychoeducational evaluation for ADD or other learning issue that may be impacting your school performance.  I would also discuss your stress and anxiety symptoms with testing with the psychologist. Jack Hood is one of my recommendations: Let me know if names or numbers needed.   Follow-up with me by email update on MyChart or office visit in the next 4-6 weeks. Sooner if worsening symptoms or new side effects with increased dose of Prozac.  For now, I wrote a letter for more time for test taking. Further recommendations may come from psychologist.   IF you received an x-ray today, you will receive an invoice from  [facility]. Please contact [facility] at [phone] with questions or concerns regarding your invoice.   IF you received labwork today, you will receive an invoice from [facility]. Please contact [facility] at [phone] with questions or concerns regarding your invoice.   Our billing staff will not be able to assist you with questions regarding bills from these companies.  You will be contacted with the lab results as soon as they are available. The fastest way to get your results is to activate your My Chart account. Instructions are located on the last page of this paperwork. If you have not heard from us regarding the results in 2 weeks, please contact this office.        I personally performed the services described in this documentation, which was scribed in my presence. The recorded information has been reviewed and considered, and addended by me as needed.   Signed,   Jack StaggersJeffrey Darek Eifler, MD [facility].  04/22/16 11:34 AM

## 2016-04-22 NOTE — Patient Instructions (Addendum)
  We can try increased dose of Prozac for now, but I would like you to meet with a psychologist for psychoeducational evaluation for ADD or other learning issue that may be impacting your school performance.  I would also discuss your stress and anxiety symptoms with testing with the psychologist. Jack Hood is one of my recommendations: Let me know if names or numbers needed.   Follow-up with me by email update on MyChart or office visit in the next 4-6 weeks. Sooner if worsening symptoms or new side effects with increased dose of Prozac.  For now, I wrote a letter for more time for test taking. Further recommendations may come from psychologist.   IF you received an x-ray today, you will receive an invoice from [facility]. Please contact [facility] at [phone] with questions or concerns regarding your invoice.   IF you received labwork today, you will receive an invoice from [facility]. Please contact [facility] at [phone] with questions or concerns regarding your invoice.   Our billing staff will not be able to assist you with questions regarding bills from these companies.  You will be contacted with the lab results as soon as they are available. The fastest way to get your results is to activate your My Chart account. Instructions are located on the last page of this paperwork. If you have not heard from us regarding the results in 2 weeks, please contact this office.

## 2016-04-23 LAB — TSH: TSH: 1.39 u[IU]/mL (ref 0.450–4.500)

## 2016-04-26 ENCOUNTER — Other Ambulatory Visit: Payer: Self-pay | Admitting: Physician Assistant

## 2016-04-29 NOTE — Telephone Encounter (Signed)
04/2016 last ov 

## 2016-05-06 ENCOUNTER — Encounter: Payer: Self-pay | Admitting: Emergency Medicine

## 2016-05-15 ENCOUNTER — Telehealth: Payer: Self-pay

## 2016-05-15 NOTE — Telephone Encounter (Signed)
Patient needs forms completed for his disability for his school. I was not sure how to complete the forms so I have highlighted the ares that need to be finished and I will place them in Dr Paralee CancelGreene's box on 05/15/16. If you could please return it to the FMLA/Disability box at the 102 checkout desk within 5-7 business days. Thank you!

## 2016-05-20 ENCOUNTER — Telehealth: Payer: Self-pay

## 2016-05-20 NOTE — Telephone Encounter (Signed)
I do have that paperwork and completed it tonight.  Typically 5-7 business days for FMLA/disability paperwork, but ready for pickup or can fax if needed. Let me know if other info needed.  -JG

## 2016-05-20 NOTE — Telephone Encounter (Signed)
See other note. Form ready.

## 2016-05-20 NOTE — Telephone Encounter (Signed)
See last message, this should be in your box

## 2016-05-20 NOTE — Telephone Encounter (Signed)
[phone]  Disability accomodation forms sent DO YOU HAVE THEM, mailed on jan 5th?  Physco eval tomorrow. With grace stroud.  LPA

## 2016-05-24 NOTE — Telephone Encounter (Signed)
Paperwork completed, scanned and faxed to school on 05/24/16

## 2016-07-06 ENCOUNTER — Other Ambulatory Visit: Payer: Self-pay | Admitting: Family Medicine

## 2016-07-07 NOTE — Telephone Encounter (Signed)
Please make an appt with Dr Neva SeatGreene for the patient.

## 2016-08-03 ENCOUNTER — Other Ambulatory Visit: Payer: Self-pay | Admitting: Physician Assistant

## 2016-09-27 ENCOUNTER — Other Ambulatory Visit: Payer: Self-pay | Admitting: Family Medicine

## 2016-10-12 ENCOUNTER — Other Ambulatory Visit: Payer: Self-pay | Admitting: Family Medicine

## 2016-12-11 ENCOUNTER — Encounter: Payer: Self-pay | Admitting: Family Medicine

## 2016-12-11 ENCOUNTER — Ambulatory Visit (INDEPENDENT_AMBULATORY_CARE_PROVIDER_SITE_OTHER): Payer: Self-pay | Admitting: Family Medicine

## 2016-12-11 VITALS — BP 124/74 | HR 60 | Temp 98.1°F | Resp 16 | Ht 70.5 in | Wt 180.0 lb

## 2016-12-11 DIAGNOSIS — F418 Other specified anxiety disorders: Secondary | ICD-10-CM

## 2016-12-11 DIAGNOSIS — G250 Essential tremor: Secondary | ICD-10-CM

## 2016-12-11 NOTE — Patient Instructions (Addendum)
  You can try Prozac every 2-3 days for 1 week, then stop it. Talk to therapist about test taking anxiety, but one option would be to try 1/2-1 of your propanolol 30 minutes to an hour prior to activity that causes anxiety.   Let me know if symptoms worsen off prozac, as I would recommend restarting once per day (I can then send in a refill).    IF you received an x-ray today, you will receive an invoice from [facility]. Please contact [facility] at [phone] with questions or concerns regarding your invoice.   IF you received labwork today, you will receive an invoice from [facility]. Please contact [facility] at [phone] with questions or concerns regarding your invoice.   Our billing staff will not be able to assist you with questions regarding bills from these companies.  You will be contacted with the lab results as soon as they are available. The fastest way to get your results is to activate your My Chart account. Instructions are located on the last page of this paperwork. If you have not heard from us regarding the results in 2 weeks, please contact this office.

## 2016-12-11 NOTE — Progress Notes (Signed)
By signing my name below, I, Jack Hood, attest that this documentation has been prepared under the direction and in the presence of Meredith StaggersJeffrey Nyema Hachey, MD.  Electronically Signed: Arvilla MarketMesha Hood, Jack Hood. 12/11/16. 12:35 PM.  Subjective:    Patient ID: Jack Hood, male    DOB: [birth year], 21 y.o.   MRN: [id number]  HPI Chief Complaint  Patient presents with  . Medication Problem    wants to come off prozac    HPI Comments: Jack SjogrenLane T Rackley is a 21 y.o. male who [facility] to discuss prozac. He has a history of depression with deterioration of school performance when discussed in Dec 2017 with test taking anxiety and decreased focus. He had a psychoeducational evaluation pending. We increased prozac to 40 mg QD and accomodation were provided by his school.   Pt hasn't been depressed. Pt has been taking prozac 1x a day since the begining of the summer and occasionally discontinued for days at a time. The time he went to the beach for a week without his medication he was fine. He isn't sure if his anxiety isn't showing since he isn't currently in school. He still has anxiety with test taking and notes "scoring well" on the psychoeducational test, but his processing speed is the problem (was told it was a personal problem). Pt has spoke to a counselor (same person that did the psychoedu eval) 1x about his test taking anxiety but he doesn't think it helped with his sxs. Pt  takes propranolol BID for occasional tremors - never taken before a test. Denies SI, thoughts of self harm, drinking, light-headedness, dizziness or other sxs with his medication  There are no active problems to display for this patient.  Past Jack History:  Diagnosis Date  . Allergy    Past Surgical History:  Procedure Laterality Date  . APPLICATION OF WOUND VAC Left 11/13/2014   Procedure: APPLICATION OF WOUND VAC;  Surgeon: Betha LoaKevin Kuzma, MD;  Location: [facility];  Service: Orthopedics;   Laterality: Left;  . I&D EXTREMITY Left 11/13/2014   Procedure: IRRIGATION AND DEBRIDEMENT LEFT ELBOW AND HAND;  Surgeon: Betha LoaKevin Kuzma, MD;  Location: [facility];  Service: Orthopedics;  Laterality: Left;   No Known Allergies Prior to Admission medications   Medication Sig Start Date End Date Taking? Authorizing Provider  FLUoxetine (PROZAC) 20 MG tablet TAKE 1 TABLET EVERY DAY. 04/22/16  Yes Shade FloodGreene, Curly Mackowski R, MD  propranolol (INDERAL) 10 MG tablet Take 2 tablets (20 mg total) by mouth 2 (two) times daily. No more refills without office visit 10/14/16  Yes Shade FloodGreene, Krue Peterka R, MD   Social History   Social History  . Marital status: Single    Spouse name: N/A  . Number of children: N/A  . Years of education: N/A   Occupational History  . Not on file.   Social History Main Topics  . Smoking status: Never Smoker  . Smokeless tobacco: Never Used  . Alcohol use No  . Drug use: No  . Sexual activity: No   Other Topics Concern  . Not on file   Social History Narrative   Single. Education: [employer]. Exercises everyday for 1-2 hours.         Review of Systems  Neurological: Positive for tremors. Negative for dizziness and light-headedness.  Psychiatric/Behavioral: Negative for dysphoric mood, self-injury and suicidal ideas. The patient is nervous/anxious.    Objective:  Physical Exam  Constitutional: He appears well-developed and well-nourished. No  distress.  HENT:  Head: Normocephalic and atraumatic.  Eyes: Conjunctivae are normal.  Neck: Neck supple.  Cardiovascular: Normal rate and regular rhythm.  Exam reveals no gallop and no friction rub.   No murmur heard. Pulmonary/Chest: Effort normal and breath sounds normal. No respiratory distress. He has no wheezes. He has no rales.  Neurological: He is alert.  Skin: Skin is warm and dry.  Psychiatric: He has a normal mood and affect. His behavior is normal.  Nursing note and vitals reviewed.   Vitals:   12/11/16 1142  BP:  124/74  Pulse: 60  Resp: 16  Temp: 98.1 F (36.7 C)  TempSrc: Oral  SpO2: 97%  Weight: 180 lb (81.6 kg)  Height: 5' 10.5" (1.791 m)  Body mass index is 25.46 kg/m. Assessment & Plan:  Jack Hood is a 21 y.o. male Situational anxiety  Essential tremor  Overall feels like his anxiety has improved to only rare/situational anxiety with test taking as above. Agree with trial off Prozac, can taper to 20 mg every few days if needed for this week, but discussed long-acting nature and unlikely to have withdrawal.  -With history of essential tremor and use of propanolol, he can also try 1/2-1 propanolol prior to situational stressors or performance anxiety treatment.  -RTC precautions, and if increasing anxiety would recommend restarting Prozac 20 mg daily.   No orders of the defined types were placed in this encounter.  Patient Instructions    You can try Prozac every 2-3 days for 1 week, then stop it. Talk to therapist about test taking anxiety, but one option would be to try 1/2-1 of your propanolol 30 minutes to an hour prior to activity that causes anxiety.   Let me know if symptoms worsen off prozac, as I would recommend restarting once per day (I can then send in a refill).    IF you received an x-ray today, you will receive an invoice from [facility]. Please contact [facility] at [phone] with questions or concerns regarding your invoice.   IF you received labwork today, you will receive an invoice from [facility]. Please contact [facility] at [phone] with questions or concerns regarding your invoice.   Our billing staff will not be able to assist you with questions regarding bills from these companies.  You will be contacted with the lab results as soon as they are available. The fastest way to get your results is to activate your My Chart account. Instructions are located on the last page of this paperwork. If you have not heard from [place] regarding the  results in 2 weeks, please contact this office.       I personally performed the services described in this documentation, which was scribed in my presence. The recorded information has been reviewed and considered for accuracy and completeness, addended by me as needed, and agree with information above.  Signed,   Meredith Staggers, MD [facility].  12/12/16 11:54 AM

## 2016-12-18 ENCOUNTER — Telehealth: Payer: Self-pay | Admitting: Family Medicine

## 2016-12-18 NOTE — Telephone Encounter (Signed)
Pt mother Toniann FailWendy states that you was suppose to call a refill in for the patient propranolol (INDERAL) 10 MG tablet and it was not called in.  Please adv  Contact number [phone]

## 2016-12-19 MED ORDER — PROPRANOLOL HCL 10 MG PO TABS: 20.0000 mg | ORAL_TABLET | Freq: Two times a day (BID) | ORAL | 1 refills | Status: DC

## 2016-12-19 NOTE — Telephone Encounter (Signed)
Refilled Propranolol ?

## 2017-06-06 ENCOUNTER — Other Ambulatory Visit: Payer: Self-pay | Admitting: Family Medicine

## 2022-03-11 ENCOUNTER — Ambulatory Visit
Admission: EM | Admit: 2022-03-11 | Discharge: 2022-03-11 | Disposition: A | Discharge status: Home / Self Care | Payer: 59 | Attending: Urgent Care | Admitting: Urgent Care

## 2022-03-11 DIAGNOSIS — L02219 Cutaneous abscess of trunk, unspecified: Secondary | ICD-10-CM | POA: Diagnosis not present

## 2022-03-11 DIAGNOSIS — L03319 Cellulitis of trunk, unspecified: Secondary | ICD-10-CM | POA: Diagnosis not present

## 2022-03-11 MED ORDER — DOXYCYCLINE HYCLATE 100 MG PO CAPS: 100.0000 mg | ORAL_CAPSULE | Freq: Two times a day (BID) | ORAL | 0 refills | Status: AC

## 2022-03-11 NOTE — ED Triage Notes (Signed)
Pt c/o white head bumps that has been popping up under rt arm for the past 3 wks. States now has one that is tender.

## 2022-03-11 NOTE — ED Provider Notes (Signed)
[facility]  Note:  This document was prepared using Systems analyst and may include unintentional dictation errors.  MRN: [id number] DOB: 1996/01/04  Subjective:   Jack Hood is a 26 y.o. male presenting for 3-week history of persistent intermittent infections and red spots over her the right trunk extending into the axillary region.  Patient has been able to open these areas up and get them to drain.  However they keep coming back in 1 area particular is very persistent and painful.  Has a history of needed an incision and drainage of his extremity with application of a wound VAC in 2016.  No current facility-administered medications for this encounter. No current outpatient medications on file.   No Known Allergies  Past Medical History:  Diagnosis Date   Allergy      Past Surgical History:  Procedure Laterality Date   APPLICATION OF WOUND VAC Left 11/13/2014   Procedure: APPLICATION OF WOUND VAC;  Surgeon: Leanora Cover, MD;  Location: [place] [facility];  Service: Orthopedics;  Laterality: Left;   I & D EXTREMITY Left 11/13/2014   Procedure: IRRIGATION AND DEBRIDEMENT LEFT ELBOW AND HAND;  Surgeon: Leanora Cover, MD;  Location: [facility];  Service: Orthopedics;  Laterality: Left;    Family History  Problem Relation Age of Onset   Hyperlipidemia Father    COPD Maternal Grandfather     Social History   Tobacco Use   Smoking status: Never   Smokeless tobacco: Never  Substance Use Topics   Alcohol use: No    Alcohol/week: 0.0 standard drinks of alcohol   Drug use: No    ROS   Objective:   Vitals: BP 128/82 (BP Location: Left Arm)   Pulse 76   Temp 98.2 F (36.8 C) (Oral)   Resp 16   SpO2 98%   Physical Exam Constitutional:      General: He is not in acute distress.    Appearance: Normal appearance. He is well-developed and normal weight. He is not ill-appearing, toxic-appearing or diaphoretic.  HENT:     Head: Normocephalic and  atraumatic.     Right Ear: External ear normal.     Left Ear: External ear normal.     Nose: Nose normal.     Mouth/Throat:     Pharynx: Oropharynx is clear.  Eyes:     General: No scleral icterus.       Right eye: No discharge.        Left eye: No discharge.     Extraocular Movements: Extraocular movements intact.  Cardiovascular:     Rate and Rhythm: Normal rate.  Pulmonary:     Effort: Pulmonary effort is normal.  Musculoskeletal:     Cervical back: Normal range of motion.  Skin:      Neurological:     Mental Status: He is alert and oriented to person, place, and time.  Psychiatric:        Mood and Affect: Mood normal.        Behavior: Behavior normal.        Thought Content: Thought content normal.        Judgment: Judgment normal.       Assessment and Plan :   PDMP not reviewed this encounter.  1. Cellulitis and abscess of trunk     Resolving abscess with associated lightest.  Area is not amenable to incision and drainage.  Recommended starting doxycycline.  Use warm compresses. Counseled patient on potential  for adverse effects with medications prescribed/recommended today, ER and return-to-clinic precautions discussed, patient verbalized understanding.    Wallis Bamberg, PA-C 03/11/22 1415
